# Patient Record
Sex: Male | Born: 2000 | Race: Black or African American | Hispanic: No | Marital: Single | Smoking: Never smoker
Health system: Southern US, Community
[De-identification: ages and names within clinical notes are randomized; demographics above are authoritative.]

## PROBLEM LIST (undated history)

## (undated) NOTE — *Deleted (*Deleted)
History and Physical    Rube Sanchez ZOX:[id number] DOB: December 16, 2000 DOA: 05/02/2020  PCP: Patient, No Pcp Per (Confirm with patient/family/NH records and if not entered, this has to be entered at [facility] point of entry) Patient coming from: ***  I have personally briefly reviewed patient's old medical records in [facility]  Chief Complaint: ***  HPI: Conlee Sliter is a 38 y.o. male with medical history significant of *** (For level 3, the HPI must include 4+ descriptors: Location, Quality, Severity, Duration, Timing, Context, modifying factors, associated signs/symptoms and/or status of 3+ chronic problems.)  (Please avoid self-populating past medical history here) (The initial 2-3 lines should be focused and good to copy and paste in the HPI section of the daily progress note).  ED Course: ***  Review of Systems: As per HPI otherwise 10 point review of systems negative.  Unacceptable ROS statements: "10 systems reviewed," "Extensive" (without elaboration).  Acceptable ROS statements: "All others negative," "All others reviewed and are negative," and "All others unremarkable," with at LEAST ONE ROS documented Can't double dip - if using for HPI can't use for ROS  History reviewed. No pertinent past medical history.  *** The histories are not reviewed yet. Please review them in the "History" navigator section and refresh this SmartLink.   has no history on file for tobacco use, alcohol use, and drug use.  No Known Allergies  History reviewed. No pertinent family history. *** Unacceptable: Noncontributory, unremarkable, or negative. Acceptable: (example)Family history negative for heart disease  Prior to Admission medications   Not on File    Physical Exam: Vitals:   05/02/20 2045 05/02/20 2100 05/02/20 2115 05/02/20 2130  BP: (!) 94/42 129/88 (!) 102/36 (!) 93/38  Pulse: (!) 112 (!) 148 95 95  Resp: (!) 32 (!) 34 16 16  Temp:      TempSrc:      SpO2: 100% 97% 97% 92%   Weight:      Height:        Constitutional: NAD, calm, comfortable Vitals:   05/02/20 2045 05/02/20 2100 05/02/20 2115 05/02/20 2130  BP: (!) 94/42 129/88 (!) 102/36 (!) 93/38  Pulse: (!) 112 (!) 148 95 95  Resp: (!) 32 (!) 34 16 16  Temp:      TempSrc:      SpO2: 100% 97% 97% 92%  Weight:      Height:       Eyes: PERRL, lids and conjunctivae normal ENMT: Mucous membranes are moist. Posterior pharynx clear of any exudate or lesions.Normal dentition.  Neck: normal, supple, no masses, no thyromegaly Respiratory: clear to auscultation bilaterally, no wheezing, no crackles. Normal respiratory effort. No accessory muscle use.  Cardiovascular: Regular rate and rhythm, no murmurs / rubs / gallops. No extremity edema. 2+ pedal pulses. No carotid bruits.  Abdomen: no tenderness, no masses palpated. No hepatosplenomegaly. Bowel sounds positive.  Musculoskeletal: no clubbing / cyanosis. No joint deformity upper and lower extremities. Good ROM, no contractures. Normal muscle tone.  Skin: no rashes, lesions, ulcers. No induration Neurologic: CN 2-12 grossly intact. Sensation intact, DTR normal. Strength 5/5 in all 4.  Psychiatric: Normal judgment and insight. Alert and oriented x 3. Normal mood.   (Anything < 9 systems with 2 bullets each down codes to level 1) (If patient refuses exam can't bill higher level) (Make sure to document decubitus ulcers present on admission -- if possible -- and whether patient has chronic indwelling catheter at time of admission)  Labs on Admission: I  have personally reviewed following labs and imaging studies  CBC: Recent Labs  Lab 05/02/20 1656  WBC 6.0  HGB 15.5  HCT 46.1  MCV 95.4  PLT 218   Basic Metabolic Panel: Recent Labs  Lab 05/02/20 1656  NA 132*  K 3.7  CL 96*  CO2 25  GLUCOSE 113*  BUN 7  CREATININE 0.87  CALCIUM 9.5   GFR: Estimated Creatinine Clearance: 141.4 mL/min (by C-G formula based on SCr of 0.87 mg/dL). Liver Function  Tests: No results for input(s): AST, ALT, ALKPHOS, BILITOT, PROT, ALBUMIN in the last 168 hours. No results for input(s): LIPASE, AMYLASE in the last 168 hours. No results for input(s): AMMONIA in the last 168 hours. Coagulation Profile: No results for input(s): INR, PROTIME in the last 168 hours. Cardiac Enzymes: No results for input(s): CKTOTAL, CKMB, CKMBINDEX, TROPONINI in the last 168 hours. BNP (last 3 results) No results for input(s): PROBNP in the last 8760 hours. HbA1C: No results for input(s): HGBA1C in the last 72 hours. CBG: Recent Labs  Lab 05/02/20 1636 05/02/20 1821  GLUCAP 124* 123*   Lipid Profile: No results for input(s): CHOL, HDL, LDLCALC, TRIG, CHOLHDL, LDLDIRECT in the last 72 hours. Thyroid Function Tests: No results for input(s): TSH, T4TOTAL, FREET4, T3FREE, THYROIDAB in the last 72 hours. Anemia Panel: No results for input(s): VITAMINB12, FOLATE, FERRITIN, TIBC, IRON, RETICCTPCT in the last 72 hours. Urine analysis: No results found for: COLORURINE, APPEARANCEUR, LABSPEC, PHURINE, GLUCOSEU, HGBUR, BILIRUBINUR, KETONESUR, PROTEINUR, UROBILINOGEN, NITRITE, LEUKOCYTESUR  Radiological Exams on Admission: CT Head Wo Contrast  Result Date: 05/02/2020 CLINICAL DATA:  Seizure, acute, history of trauma. Additional provided: Seizure, fall from bed onto floor hitting head. EXAM: CT HEAD WITHOUT CONTRAST TECHNIQUE: Contiguous axial images were obtained from the base of the skull through the vertex without intravenous contrast. COMPARISON:  No pertinent prior exams are available for comparison. FINDINGS: Brain: Cerebral volume is normal. There is no acute intracranial hemorrhage. No demarcated cortical infarct. No extra-axial fluid collection. No evidence of intracranial mass. No midline shift. Vascular: No hyperdense vessel. Skull: Normal. Negative for fracture or focal lesion. Sinuses/Orbits: Visualized orbits show no acute finding. Mild right maxillary sinus mucosal  thickening. No significant mastoid effusion. Other: Left frontal scalp/forehead soft tissue swelling. IMPRESSION: No evidence of acute intracranial abnormality. Left frontal scalp/forehead soft tissue swelling. Consider brain MRI for further evaluation, particularly if this is a first seizure. Electronically Signed   By: Jackey Loge DO   On: 05/02/2020 19:00    EKG: Independently reviewed. ***  Assessment/Plan Active Problems:   Seizure (HCC)  (please populate well all problems here in Problem List. (For example, if patient is on BP meds at home and you resume or decide to hold them, it is a problem that needs to be her. Same for CAD, COPD, HLD and so on)   ***  DVT prophylaxis: *** (Lovenox/Heparin/SCD's/anticoagulated/None (if comfort care) Code Status: *** (Full/Partial (specify details) Family Communication: *** (Specify name, relationship. Do not write "discussed with patient". Specify tel # if discussed over the phone) Disposition Plan: *** (specify when and where you expect patient to be discharged) Consults called: *** (with names) Admission status: *** (inpatient / obs / tele / medical floor / SDU)   Thalia Party MD [facility] Pager [phone]  If 7PM-7AM, please contact night-coverage [email or web address] Password [email or web address]  05/02/2020, 10:00 PM

## (undated) NOTE — *Deleted (*Deleted)
Physician Discharge Summary  Tajae Rybicki ZOX:[id number] DOB: July 14, 2000 DOA: 05/02/2020  PCP: Patient, No Pcp Per  Admit date: 05/02/2020 Discharge date: 05/04/2020  Admitted From: *** Disposition:  ***  Recommendations for Outpatient Follow-up:  1. Follow up with PCP in 1-2 weeks 2. Please obtain BMP/CBC in one week 3. Please follow up on the following pending results:  Home Health:***  Equipment/Devices:***    Discharge Condition:***  CODE STATUS:***  Diet recommendation:   Brief/Interim Summary: ***  Discharge Diagnoses:  Principal Problem:   Seizure (HCC) Active Problems:   Cannabis abuse    Discharge Instructions   Allergies as of 05/04/2020   No Known Allergies   Med Rec must be completed prior to using this SMARTLINK***       Follow-up Information    [facility] Follow up.   Why: You may call this number for follow up appointment if you do not have a primary care doctor.  Contact information: [address] [place] [address] [phone]             No Known Allergies  Consultations:  ***   Procedures/Studies: EEG  Result Date: 05/03/2020 Charlsie Quest, MD     05/03/2020  1:02 PM Patient Name: Cameron Kelley MRN: [id number] Epilepsy Attending: Charlsie Quest Referring Physician/Provider: Dr Caryl Pina Date: 05/03/2020 Duration: 27.13 mins Patient history: 45 year old male with recurrence of seizures in the context of new onset cannabis use and one prior seizure in his lifetime occurring while in the tenth grade. EEG to evaluate for seizure Level of alertness: Awake, asleep AEDs during EEG study: LEV Technical aspects: This EEG study was done with scalp electrodes positioned according to the 10-20 International system of electrode placement. Electrical activity was acquired at a sampling rate of 500Hz  and reviewed with a high frequency filter of 70Hz  and a low frequency filter of 1Hz . EEG  data were recorded continuously and digitally stored. Description: The posterior dominant rhythm consists of 9-10 Hz activity of moderate voltage (25-35 uV) seen predominantly in posterior head regions, symmetric and reactive to eye opening and eye closing. Sleep was characterized by vertex waves, sleep spindles (12 to 14 Hz), maximal frontocentral region.  Hyperventilation did not show any EEG change.  Physiologic photic driving was not seen during photic stimulation. IMPRESSION: This study is within normal limits. No seizures or epileptiform discharges were seen throughout the recording. Charlsie Quest   CT Head Wo Contrast  Result Date: 05/02/2020 CLINICAL DATA:  Seizure, acute, history of trauma. Additional provided: Seizure, fall from bed onto floor hitting head. EXAM: CT HEAD WITHOUT CONTRAST TECHNIQUE: Contiguous axial images were obtained from the base of the skull through the vertex without intravenous contrast. COMPARISON:  No pertinent prior exams are available for comparison. FINDINGS: Brain: Cerebral volume is normal. There is no acute intracranial hemorrhage. No demarcated cortical infarct. No extra-axial fluid collection. No evidence of intracranial mass. No midline shift. Vascular: No hyperdense vessel. Skull: Normal. Negative for fracture or focal lesion. Sinuses/Orbits: Visualized orbits show no acute finding. Mild right maxillary sinus mucosal thickening. No significant mastoid effusion. Other: Left frontal scalp/forehead soft tissue swelling. IMPRESSION: No evidence of acute intracranial abnormality. Left frontal scalp/forehead soft tissue swelling. Consider brain MRI for further evaluation, particularly if this is a first seizure. Electronically Signed   By: Jackey Loge DO   On: 05/02/2020 19:00      Subjective:   Discharge Exam: Vitals:  05/04/20 0555 05/04/20 1020  BP: 114/72 128/71  Pulse: 70 68  Resp: 14 14  Temp: 97.8 F (36.6 C) 98.7 F (37.1 C)  SpO2: 98% 100%    Vitals:   05/03/20 2200 05/04/20 0149 05/04/20 0555 05/04/20 1020  BP:  (!) 112/59 114/72 128/71  Pulse:  61 70 68  Resp: 12 14 14 14   Temp:  98.4 F (36.9 C) 97.8 F (36.6 C) 98.7 F (37.1 C)  TempSrc:  Oral Oral Oral  SpO2:  97% 98% 100%  Weight:   67 kg   Height:        General: Pt is alert, awake, not in acute distress Cardiovascular: RRR, S1/S2 +, no rubs, no gallops Respiratory: CTA bilaterally, no wheezing, no rhonchi Abdominal: Soft, NT, ND, bowel sounds + Extremities: no edema, no cyanosis    The results of significant diagnostics from this hospitalization (including imaging, microbiology, ancillary and laboratory) are listed below for reference.     Microbiology: Recent Results (from the past 240 hour(s))  Respiratory Panel by RT PCR (Flu A&B, Covid) - Nasopharyngeal Swab     Status: None   Collection Time: 05/02/20 10:14 PM   Specimen: Nasopharyngeal Swab  Result Value Ref Range Status   SARS Coronavirus 2 by RT PCR NEGATIVE NEGATIVE Final    Comment: (NOTE) SARS-CoV-2 target nucleic acids are NOT DETECTED.  The SARS-CoV-2 RNA is generally detectable in upper respiratoy specimens during the acute phase of infection. The lowest concentration of SARS-CoV-2 viral copies this assay can detect is 131 copies/mL. A negative result does not preclude SARS-Cov-2 infection and should not be used as the sole basis for treatment or other patient management decisions. A negative result may occur with  improper specimen collection/handling, submission of specimen other than nasopharyngeal swab, presence of viral mutation(s) within the areas targeted by this assay, and inadequate number of viral copies (<131 copies/mL). A negative result must be combined with clinical observations, patient history, and epidemiological information. The expected result is Negative.  Fact Sheet for Patients:  https://www.moore.com/  Fact Sheet for Healthcare Providers:   https://www.young.biz/  This test is no t yet approved or cleared by the [place] FDA and  has been authorized for detection and/or diagnosis of SARS-CoV-2 by FDA under an Emergency Use Authorization (EUA). This EUA will remain  in effect (meaning this test can be used) for the duration of the COVID-19 declaration under Section 564(b)(1) of the Act, 21 U.S.C. section 360bbb-3(b)(1), unless the authorization is terminated or revoked sooner.     Influenza A by PCR NEGATIVE NEGATIVE Final   Influenza B by PCR NEGATIVE NEGATIVE Final    Comment: (NOTE) The Xpert Xpress SARS-CoV-2/FLU/RSV assay is intended as an aid in  the diagnosis of influenza from Nasopharyngeal swab specimens and  should not be used as a sole basis for treatment. Nasal washings and  aspirates are unacceptable for Xpert Xpress SARS-CoV-2/FLU/RSV  testing.  Fact Sheet for Patients: https://www.moore.com/  Fact Sheet for Healthcare Providers: https://www.young.biz/  This test is not yet approved or cleared by the [place] FDA and  has been authorized for detection and/or diagnosis of SARS-CoV-2 by  FDA under an Emergency Use Authorization (EUA). This EUA will remain  in effect (meaning this test can be used) for the duration of the  Covid-19 declaration under Section 564(b)(1) of the Act, 21  U.S.C. section 360bbb-3(b)(1), unless the authorization is  terminated or revoked. Performed at [facility], [address]  [place], [place] [address]      Labs: BNP (last 3 results) No results for input(s): BNP in the last 8760 hours. Basic Metabolic Panel: Recent Labs  Lab 05/02/20 1656 05/03/20 0433 05/03/20 0914 05/04/20 0553  NA 132*  --  138 137  K 3.7  --  3.8 3.7  CL 96*  --  105 104  CO2 25  --  24 23  GLUCOSE 113*  --  94 88  BUN 7  --  10 11  CREATININE 0.87  --  1.20 1.06  CALCIUM 9.5  --  8.9 8.9  MG  --  2.2  2.2 1.9  PHOS  --   --  3.7 3.7   Liver Function Tests: Recent Labs  Lab 05/03/20 0914 05/04/20 0553  AST 30 36  ALT 22 21  ALKPHOS 44 36*  BILITOT 1.1 1.1  PROT 7.0 6.7  ALBUMIN 4.0 3.8   No results for input(s): LIPASE, AMYLASE in the last 168 hours. No results for input(s): AMMONIA in the last 168 hours. CBC: Recent Labs  Lab 05/02/20 1656 05/03/20 0914 05/04/20 0553  WBC 6.0 8.0 5.0  NEUTROABS  --  6.4 3.1  HGB 15.5 14.1 13.4  HCT 46.1 41.2 40.3  MCV 95.4 95.4 96.6  PLT 218 183 181   Cardiac Enzymes: No results for input(s): CKTOTAL, CKMB, CKMBINDEX, TROPONINI in the last 168 hours. BNP: Invalid input(s): POCBNP CBG: Recent Labs  Lab 05/03/20 1142 05/03/20 1650 05/03/20 2338 05/04/20 0557 05/04/20 1139  GLUCAP 81 85 87 93 88   D-Dimer No results for input(s): DDIMER in the last 72 hours. Hgb A1c Recent Labs    05/04/20 0553  HGBA1C 5.1   Lipid Profile No results for input(s): CHOL, HDL, LDLCALC, TRIG, CHOLHDL, LDLDIRECT in the last 72 hours. Thyroid function studies No results for input(s): TSH, T4TOTAL, T3FREE, THYROIDAB in the last 72 hours.  Invalid input(s): FREET3 Anemia work up No results for input(s): VITAMINB12, FOLATE, FERRITIN, TIBC, IRON, RETICCTPCT in the last 72 hours. Urinalysis No results found for: COLORURINE, APPEARANCEUR, LABSPEC, PHURINE, GLUCOSEU, HGBUR, BILIRUBINUR, KETONESUR, PROTEINUR, UROBILINOGEN, NITRITE, LEUKOCYTESUR Sepsis Labs Invalid input(s): PROCALCITONIN,  WBC,  LACTICIDVEN Microbiology Recent Results (from the past 240 hour(s))  Respiratory Panel by RT PCR (Flu A&B, Covid) - Nasopharyngeal Swab     Status: None   Collection Time: 05/02/20 10:14 PM   Specimen: Nasopharyngeal Swab  Result Value Ref Range Status   SARS Coronavirus 2 by RT PCR NEGATIVE NEGATIVE Final    Comment: (NOTE) SARS-CoV-2 target nucleic acids are NOT DETECTED.  The SARS-CoV-2 RNA is generally detectable in upper respiratoy specimens  during the acute phase of infection. The lowest concentration of SARS-CoV-2 viral copies this assay can detect is 131 copies/mL. A negative result does not preclude SARS-Cov-2 infection and should not be used as the sole basis for treatment or other patient management decisions. A negative result may occur with  improper specimen collection/handling, submission of specimen other than nasopharyngeal swab, presence of viral mutation(s) within the areas targeted by this assay, and inadequate number of viral copies (<131 copies/mL). A negative result must be combined with clinical observations, patient history, and epidemiological information. The expected result is Negative.  Fact Sheet for Patients:  https://www.moore.com/  Fact Sheet for Healthcare Providers:  https://www.young.biz/  This test is no t yet approved or cleared by the [place] FDA and  has been authorized for detection and/or diagnosis of SARS-CoV-2 by FDA under an Emergency Use  Authorization (EUA). This EUA will remain  in effect (meaning this test can be used) for the duration of the COVID-19 declaration under Section 564(b)(1) of the Act, 21 U.S.C. section 360bbb-3(b)(1), unless the authorization is terminated or revoked sooner.     Influenza A by PCR NEGATIVE NEGATIVE Final   Influenza B by PCR NEGATIVE NEGATIVE Final    Comment: (NOTE) The Xpert Xpress SARS-CoV-2/FLU/RSV assay is intended as an aid in  the diagnosis of influenza from Nasopharyngeal swab specimens and  should not be used as a sole basis for treatment. Nasal washings and  aspirates are unacceptable for Xpert Xpress SARS-CoV-2/FLU/RSV  testing.  Fact Sheet for Patients: https://www.moore.com/  Fact Sheet for Healthcare Providers: https://www.young.biz/  This test is not yet approved or cleared by the [place] FDA and  has been authorized for detection and/or  diagnosis of SARS-CoV-2 by  FDA under an Emergency Use Authorization (EUA). This EUA will remain  in effect (meaning this test can be used) for the duration of the  Covid-19 declaration under Section 564(b)(1) of the Act, 21  U.S.C. section 360bbb-3(b)(1), unless the authorization is  terminated or revoked. Performed at [facility], [address] [place], [place] [address]     Time coordinating discharge: Over 30 minutes  SIGNED:  Merlene Laughter, DO [facility] 05/04/2020, 11:47 AM Pager is on AMION  If 7PM-7AM, please contact night-coverage [email or web address]

---
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
## 2021-01-28 ENCOUNTER — Ambulatory Visit: Payer: Self-pay | Admitting: Neurology

## 2021-07-27 IMAGING — MR MR HEAD W/O CM
16 series · 48 of 48 positions shown · non-contrast
Comparison: Noncontrast head CT performed earlier the same day
05/02/2020.

CLINICAL DATA: Seizure, nontraumatic. Additional provided:
Seizures, loss of consciousness, tingling in legs, fell and hit
head.

EXAM:
MRI HEAD WITHOUT CONTRAST
TECHNIQUE: Multiplanar, multiecho pulse sequences of the brain and surrounding
structures were obtained without intravenous contrast.

[Series 9: DWI · axial · 3.0mm · 1.31mm/px · z∈[-26,+126]mm · 5 of 104 slices shown (1 of 6)]
[im 1/104]
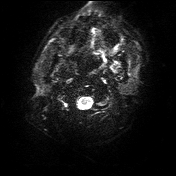
[im 26/104]
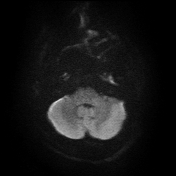
[im 52/104]
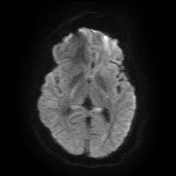
[im 78/104]
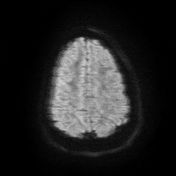
[im 104/104]
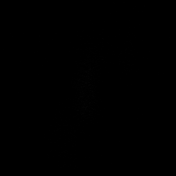

[Series 10: DWI · axial · 3.0mm · 1.31mm/px · z∈[-26,+123]mm · 3 of 50 slices shown (2 of 6)]
[im 1/50]
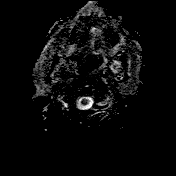
[im 25/50]
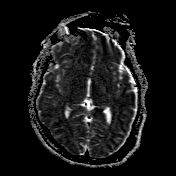
[im 50/50]
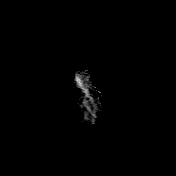

[Series 11: FLAIR · axial · 3.0mm · 0.72mm/px · z∈[-26,+127]mm · 3 of 52 slices shown (1 of 3)]
[im 1/52]
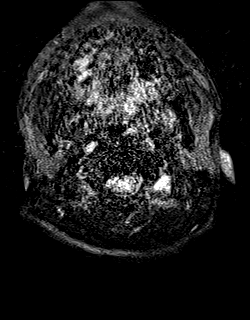
[im 26/52]
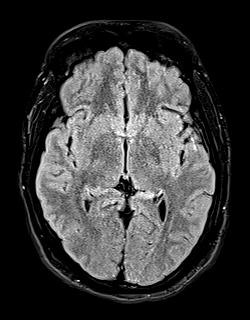
[im 52/52]
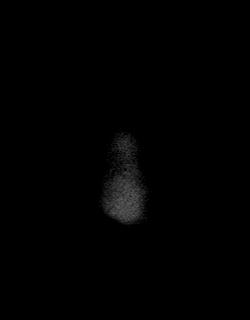

[Series 12: T1 · axial · 1.0mm · 0.90mm/px · z∈[-29,+130]mm · 8 of 160 slices shown (1 of 2)]
[im 1/160]
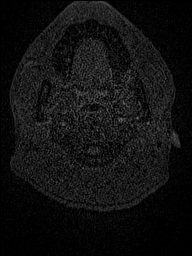
[im 23/160]
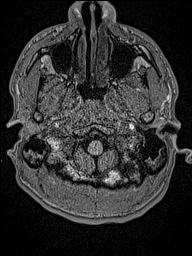
[im 46/160]
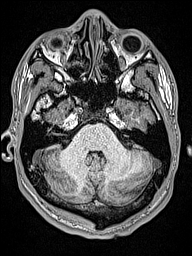
[im 69/160]
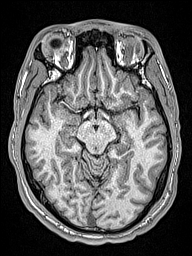
[im 91/160]
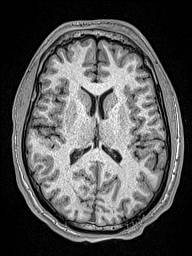
[im 114/160]
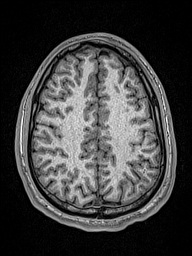
[im 137/160]
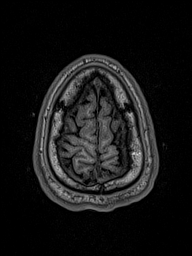
[im 160/160]
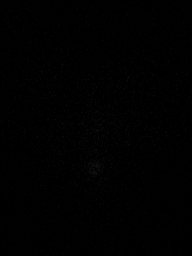

[Series 13: DWI · coronal · 5.0mm · 1.31mm/px · 4 of 76 slices shown (3 of 6)]
[im 1/76]
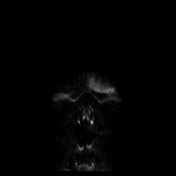
[im 26/76]
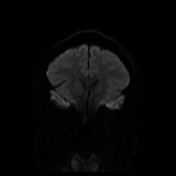
[im 51/76]
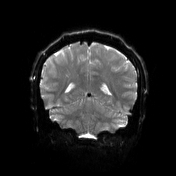
[im 76/76]
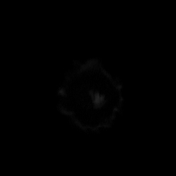

[Series 14: DWI · coronal · 5.0mm · 1.31mm/px · 2 of 38 slices shown (4 of 6)]
[im 1/38]
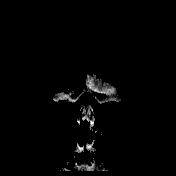
[im 38/38]
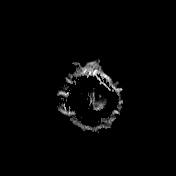

[Series 15: T1 · sagittal · 5.0mm · 0.75mm/px · 1 of 25 slices shown (2 of 2)]
[im 1/25]
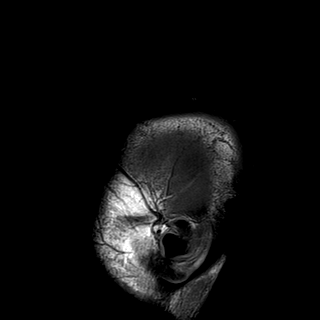

[Series 16: T2 · axial · 5.0mm · 0.60mm/px · 1 of 25 slices shown (1 of 3)]
[im 1/25]
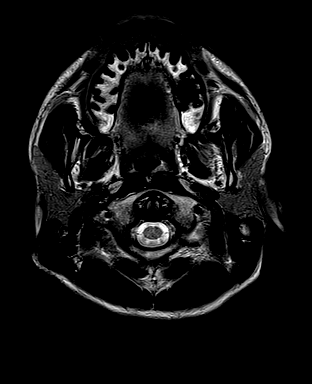

[Series 17: mip_images(sw) · axial · 24.0mm · 0.72mm/px · z∈[-16,+116]mm · 2 of 45 slices shown]
[im 1/45]
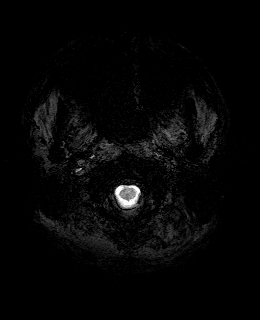
[im 45/45]
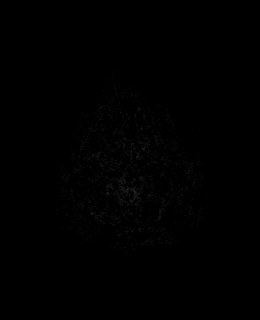

[Series 18: swi_images · axial · 3.0mm · 0.72mm/px · z∈[-26,+126]mm · 3 of 52 slices shown]
[im 1/52]
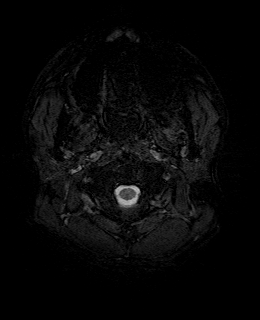
[im 26/52]
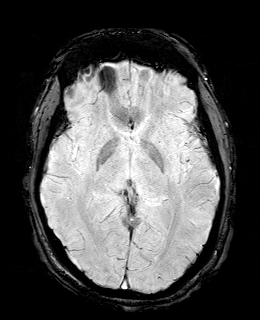
[im 52/52]
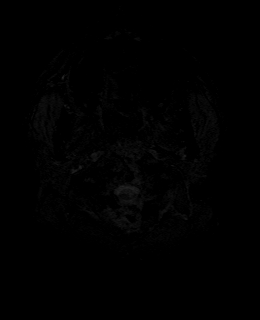

[Series 19: T2 · coronal · 5.0mm · 0.57mm/px · 2 of 37 slices shown (2 of 3)]
[im 1/37]
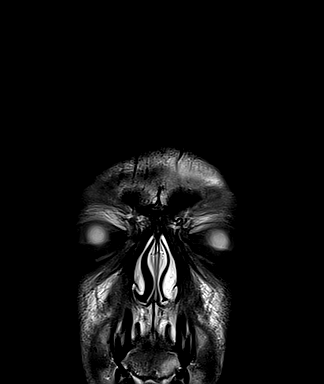
[im 37/37]
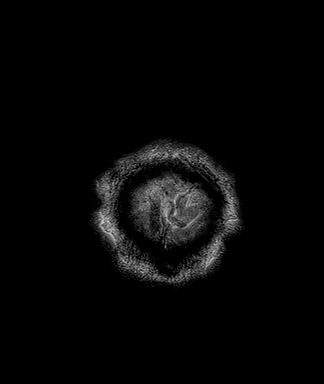

[Series 20: T2 · coronal · 3.0mm · 0.40mm/px · 2 of 34 slices shown (3 of 3)]
[im 1/34]
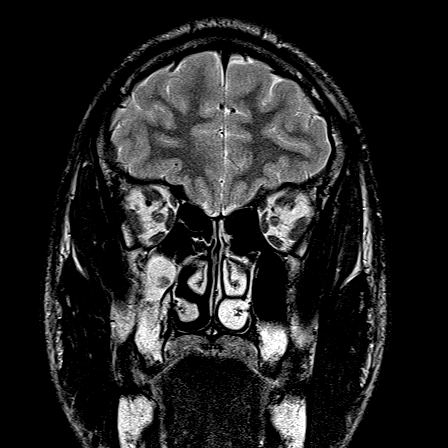
[im 34/34]
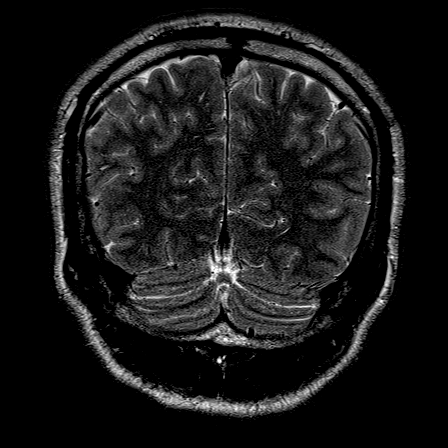

[Series 21: FLAIR · coronal · 3.0mm · 0.56mm/px · 2 of 34 slices shown (2 of 3)]
[im 1/34]
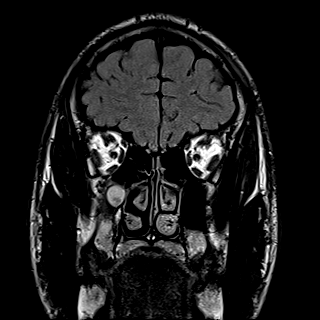
[im 34/34]
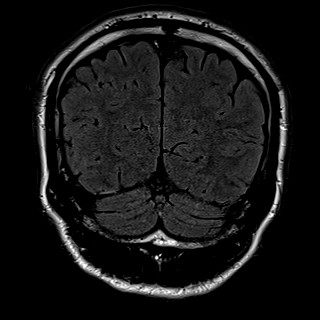

[Series 22: FLAIR · axial · 4.0mm · 0.86mm/px · z∈[-26,+126]mm · 2 of 39 slices shown (3 of 3)]
[im 1/39]
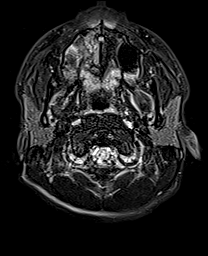
[im 39/39]
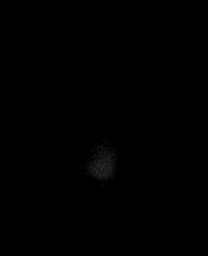

[Series 23: DWI · axial · 3.0mm · 1.31mm/px · z∈[-26,+126]mm · 5 of 103 slices shown (5 of 6)]
[im 1/103]
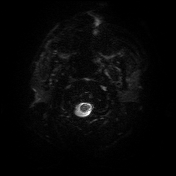
[im 26/103]
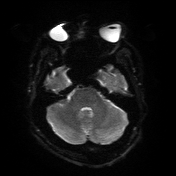
[im 52/103]
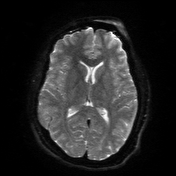
[im 77/103]
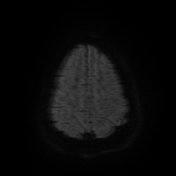
[im 103/103]
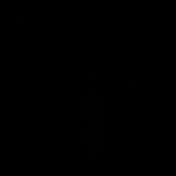

[Series 24: DWI · axial · 3.0mm · 1.31mm/px · z∈[-26,+123]mm · 3 of 51 slices shown (6 of 6)]
[im 1/51]
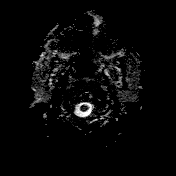
[im 26/51]
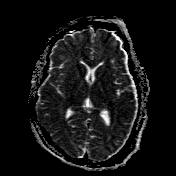
[im 51/51]
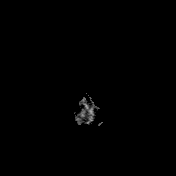

[48 of 48 positions shown; findings below may reference images not displayed]

FINDINGS: Brain:

The examination is intermittently motion degraded. Most notably,
there is moderate motion degradation of the axial DWI sequence.

Cerebral volume is normal for age.

There is diffusion weighted and T2/FLAIR hyperintense signal
abnormality within the left hippocampus as well as mild left
hippocampal swelling.

No other focal parenchymal signal abnormality is identified.

There is no acute infarct.

No chronic intracranial blood products.

No extra-axial fluid collection.

No midline shift.

Vascular: Expected proximal arterial flow voids.

Skull and upper cervical spine: No focal marrow lesion.

Sinuses/Orbits: Visualized orbits show no acute finding. Trace
ethmoid and right maxillary sinus mucosal thickening. Small right
maxillary sinus mucous retention cyst.

These results will be called to the ordering clinician or
representative by the Radiologist Assistant, and communication
documented in the PACS or [REDACTED].
IMPRESSION: Motion degraded examination.

There is diffusion weighted and T2/FLAIR hyperintense signal
abnormality within the left hippocampus, as well as mild left
hippocampal swelling. These findings may reflect seizure-related
changes. PAHA encephalitis cannot be excluded and clinical
correlation is recommended. Acute ischemia is also a differential
consideration, although considered less likely. MRI follow-up is
recommended to ensure resolution of signal abnormality at this site
and to exclude alternative etiologies.

Otherwise unremarkable MRI appearance of the brain.

## 2021-11-17 ENCOUNTER — Encounter: Payer: Self-pay | Admitting: Adult Health

## 2021-11-17 ENCOUNTER — Ambulatory Visit: Payer: Medicaid Other | Admitting: Adult Health
